# Patient Record
Sex: Female | Born: 1985 | Race: Asian | Hispanic: No | Marital: Married | Smoking: Never smoker
Health system: Southern US, Community
[De-identification: ages and names within clinical notes are randomized; demographics above are authoritative.]

---

## 2013-08-28 ENCOUNTER — Ambulatory Visit (INDEPENDENT_AMBULATORY_CARE_PROVIDER_SITE_OTHER): Payer: 59 | Admitting: Physician Assistant

## 2013-08-28 VITALS — BP 110/64 | HR 89 | Temp 98.1°F | Resp 18 | Ht 64.0 in | Wt 160.0 lb

## 2013-08-28 DIAGNOSIS — J069 Acute upper respiratory infection, unspecified: Secondary | ICD-10-CM

## 2013-08-28 MED ORDER — IPRATROPIUM BROMIDE 0.03 % NA SOLN: 2.0000 | Freq: Two times a day (BID) | NASAL | Status: DC

## 2013-08-28 MED ORDER — BENZONATATE 100 MG PO CAPS: 100.0000 mg | ORAL_CAPSULE | Freq: Three times a day (TID) | ORAL | Status: DC | PRN

## 2013-08-28 MED ORDER — GUAIFENESIN ER 1200 MG PO TB12: 1.0000 | ORAL_TABLET | Freq: Two times a day (BID) | ORAL | Status: DC | PRN

## 2013-08-28 NOTE — Patient Instructions (Signed)
Get plenty of rest and drink at least 64 ounces of water daily. Delegate as much as you can to others, to allow you to rest.   Continue to use ibuprofen (up to 800 mg 3 times daily with food) and/or acetaminophen as needed for throat pain.

## 2013-08-28 NOTE — Progress Notes (Signed)
Subjective:    Patient ID: Jasmine Russell, female    DOB: 08-26-1986, 27 y.o.   MRN: [id number]  HPI This 27 y.o. female presents for evaluation of upper respiratory illness. Began 4 days ago.  High fever (subjective) and chills, nasal congestion and runny nose, sore throat, cough and ear pain. Took ibuprofen, with some benefit.  Robitussin without benefit. Throat is less uncomfortable now, but congestion persists and cough is worse, especially at night. Her children have recently had the same symptoms, but theirs have resolved completely. Some nausea, but no vomiting or diarrhea.   History reviewed. No pertinent past medical history.  History reviewed. No pertinent past surgical history.  Prior to Admission medications   Medication Sig Start Date End Date Taking? Authorizing Provider  ibuprofen (ADVIL,MOTRIN) 200 MG tablet Take 200 mg by mouth every 6 (six) hours as needed for pain.   Yes Historical Provider, MD    No Known Allergies  History   Social History  . Marital Status: Married    Spouse Name: Deane Melick    Number of Children: 2  . Years of Education: college   Occupational History  . Homemaker    Social History Main Topics  . Smoking status: Never Smoker   . Smokeless tobacco: Never Used  . Alcohol Use: No  . Drug Use: No  . Sexual Activity: Not on file   Other Topics Concern  . Not on file   Social History Narrative   Originally from [place], came to the [place] in 2007.  Lives with her husband and their 2 children.  Her parents live in [place].    History reviewed. No pertinent family history.    Review of Systems As above.    Objective:   Physical Exam  Vitals reviewed. Constitutional: She is oriented to person, place, and time. Vital signs are normal. She appears well-developed and well-nourished. No distress.  HENT:  Head: Normocephalic and atraumatic.  Right Ear: Hearing, tympanic membrane, external ear and ear canal normal.  Left Ear: Hearing,  tympanic membrane, external ear and ear canal normal.  Nose: Mucosal edema and rhinorrhea present.  No foreign bodies. Right sinus exhibits no maxillary sinus tenderness and no frontal sinus tenderness. Left sinus exhibits no maxillary sinus tenderness and no frontal sinus tenderness.  Mouth/Throat: Uvula is midline, oropharynx is clear and moist and mucous membranes are normal. No edematous. No oropharyngeal exudate.  Allergic crease noted.  Eyes: Conjunctivae and EOM are normal. Pupils are equal, round, and reactive to light. Right eye exhibits no discharge. Left eye exhibits no discharge. No scleral icterus.  Neck: Trachea normal, normal range of motion and full passive range of motion without pain. Neck supple. No mass and no thyromegaly present.  Cardiovascular: Normal rate, regular rhythm and normal heart sounds.   Pulmonary/Chest: Effort normal and breath sounds normal.  Lymphadenopathy:       Head (right side): No submandibular, no tonsillar, no preauricular, no posterior auricular and no occipital adenopathy present.       Head (left side): No submandibular, no tonsillar, no preauricular and no occipital adenopathy present.    She has no cervical adenopathy.       Right: No supraclavicular adenopathy present.       Left: No supraclavicular adenopathy present.  Neurological: She is alert and oriented to person, place, and time. She has normal strength. No cranial nerve deficit or sensory deficit.  Skin: Skin is warm, dry and intact. No rash noted.  Psychiatric: She  has a normal mood and affect. Her speech is normal and behavior is normal.          Assessment & Plan:

## 2014-02-13 ENCOUNTER — Ambulatory Visit (INDEPENDENT_AMBULATORY_CARE_PROVIDER_SITE_OTHER): Payer: 59 | Admitting: Emergency Medicine

## 2014-02-13 VITALS — BP 130/78 | HR 75 | Temp 97.7°F | Resp 16 | Ht 64.5 in | Wt 164.4 lb

## 2014-02-13 DIAGNOSIS — R002 Palpitations: Secondary | ICD-10-CM

## 2014-02-13 DIAGNOSIS — R42 Dizziness and giddiness: Secondary | ICD-10-CM

## 2014-02-13 DIAGNOSIS — N3000 Acute cystitis without hematuria: Secondary | ICD-10-CM

## 2014-02-13 LAB — POCT URINALYSIS DIPSTICK
Bilirubin, UA: NEGATIVE
Blood, UA: NEGATIVE
GLUCOSE UA: NEGATIVE
KETONES UA: NEGATIVE
Nitrite, UA: NEGATIVE
Protein, UA: NEGATIVE
Urobilinogen, UA: 0.2
pH, UA: 6.5

## 2014-02-13 LAB — POCT CBC
GRANULOCYTE PERCENT: 57.4 % (ref 37–80)
HCT, POC: 38.5 % (ref 37.7–47.9)
Hemoglobin: 12.2 g/dL (ref 12.2–16.2)
Lymph, poc: 3.1 (ref 0.6–3.4)
MCH, POC: 26.3 pg — AB (ref 27–31.2)
MCHC: 31.7 g/dL — AB (ref 31.8–35.4)
MCV: 83.1 fL (ref 80–97)
MID (cbc): 0.6 (ref 0–0.9)
MPV: 9.9 fL (ref 0–99.8)
POC Granulocyte: 5 (ref 2–6.9)
POC LYMPH PERCENT: 35.7 %L (ref 10–50)
POC MID %: 6.9 %M (ref 0–12)
Platelet Count, POC: 278 10*3/uL (ref 142–424)
RBC: 4.63 M/uL (ref 4.04–5.48)
RDW, POC: 13.5 %
WBC: 8.7 10*3/uL (ref 4.6–10.2)

## 2014-02-13 LAB — POCT UA - MICROSCOPIC ONLY
BACTERIA, U MICROSCOPIC: NEGATIVE
Casts, Ur, LPF, POC: NEGATIVE
Crystals, Ur, HPF, POC: NEGATIVE
MUCUS UA: NEGATIVE
Yeast, UA: NEGATIVE

## 2014-02-13 LAB — POCT URINE PREGNANCY: Preg Test, Ur: NEGATIVE

## 2014-02-13 MED ORDER — CIPROFLOXACIN HCL 500 MG PO TABS: 500.0000 mg | ORAL_TABLET | Freq: Two times a day (BID) | ORAL | Status: DC

## 2014-02-13 NOTE — Patient Instructions (Signed)
Palpitations   A palpitation is the feeling that your heartbeat is irregular or is faster than normal. It may feel like your heart is fluttering or skipping a beat. Palpitations are usually not a serious problem. However, in some cases, you may need further medical evaluation.  CAUSES   Palpitations can be caused by:   Smoking.   Caffeine or other stimulants, such as diet pills or energy drinks.   Alcohol.   Stress and anxiety.   Strenuous physical activity.   Fatigue.   Certain medicines.   Heart disease, especially if you have a history of arrhythmias. This includes atrial fibrillation, atrial flutter, or supraventricular tachycardia.   An improperly working pacemaker or defibrillator.  DIAGNOSIS   To find the cause of your palpitations, your caregiver will take your history and perform a physical exam. Tests may also be done, including:   Electrocardiography (ECG). This test records the heart's electrical activity.   Cardiac monitoring. This allows your caregiver to monitor your heart rate and rhythm in real time.   Holter monitor. This is a portable device that records your heartbeat and can help diagnose heart arrhythmias. It allows your caregiver to track your heart activity for several days, if needed.   Stress tests by exercise or by giving medicine that makes the heart beat faster.  TREATMENT   Treatment of palpitations depends on the cause of your symptoms and can vary greatly. Most cases of palpitations do not require any treatment other than time, relaxation, and monitoring your symptoms. Other causes, such as atrial fibrillation, atrial flutter, or supraventricular tachycardia, usually require further treatment.  HOME CARE INSTRUCTIONS    Avoid:   Caffeinated coffee, tea, soft drinks, diet pills, and energy drinks.   Chocolate.   Alcohol.   Stop smoking if you smoke.   Reduce your stress and anxiety. Things that can help you relax include:   A method that measures bodily functions so  you can learn to control them (biofeedback).   Yoga.   Meditation.   Physical activity such as swimming, jogging, or walking.   Get plenty of rest and sleep.  SEEK MEDICAL CARE IF:    You continue to have a fast or irregular heartbeat beyond 24 hours.   Your palpitations occur more often.  SEEK IMMEDIATE MEDICAL CARE IF:   You develop chest pain or shortness of breath.   You have a severe headache.   You feel dizzy, or you faint.  MAKE SURE YOU:   Understand these instructions.   Will watch your condition.   Will get help right away if you are not doing well or get worse.  Document Released: 11/16/2000 Document Revised: 03/16/2013 Document Reviewed: 01/18/2012  Exit[employer] Patient Information 2014 [employer].

## 2014-02-13 NOTE — Progress Notes (Signed)
[facility] [address], [place] [id number] [phone]  Date:  02/13/2014   Name:  Jasmine Russell   DOB:  11/21/1986   MRN:  [id number]  PCP:  No primary provider on file.    Chief Complaint: Menorrhagia and some congestion in throat   History of Present Illness:  Jasmine Russell is a 28 y.o. very pleasant female patient who presents with the following:  Says she missed her period in February and experienced unusually heavy bleeding for the menses in March changing pads every 30 - 60 minutes.  No cramps or tissue.  Sexually active and using condoms.  G2P2. No dyspareunia.  Says she has been dizzy and experiencing a rapid heart rate at times.   Followed by fatigue.  No focal neuro or visual symptoms.  Says she feels palpitations since her last period.  Is concerned she may be anemic.  No improvement with over the counter medications or other home remedies. Denies other complaint or health concern today. .    There are no active problems to display for this patient.   History reviewed. No pertinent past medical history.  History reviewed. No pertinent past surgical history.  History  Substance Use Topics  . Smoking status: Never Smoker   . Smokeless tobacco: Never Used  . Alcohol Use: No    History reviewed. No pertinent family history.  No Known Allergies  Medication list has been reviewed and updated.  Current Outpatient Prescriptions on File Prior to Visit  Medication Sig Dispense Refill  . ibuprofen (ADVIL,MOTRIN) 200 MG tablet Take 200 mg by mouth every 6 (six) hours as needed for pain.       No current facility-administered medications on file prior to visit.    Review of Systems:  As per HPI, otherwise negative.    Physical Examination: Filed Vitals:   02/13/14 1442  BP: 130/78  Pulse: 75  Temp: 97.7 F (36.5 C)  Resp: 16   Filed Vitals:   02/13/14 1442  Height: 5' 4.5" (1.638 m)  Weight: 164 lb 6.4 oz (74.571 kg)   Body  mass index is 27.79 kg/(m^2). Ideal Body Weight: Weight in (lb) to have BMI = 25: 147.6  GEN: WDWN, NAD, Non-toxic, A & O x 3 HEENT: Atraumatic, Normocephalic. Neck supple. No masses, No LAD. Ears and Nose: No external deformity. CV: RRR, No M/G/R. No JVD. No thrill. No extra heart sounds. PULM: CTA B, no wheezes, crackles, rhonchi. No retractions. No resp. distress. No accessory muscle use. ABD: S, NT, ND, +BS. No rebound. No HSM. EXTR: No c/c/e NEURO Normal gait.  PSYCH: Normally interactive. Conversant. Not depressed or anxious appearing.  Calm demeanor.    Assessment and Plan: Cystitis Dizziness Palpitations Normal EKG.  Labs pending   Signed,  Phillips OdorJeffery Zuleyka Kloc, MD   Results for orders placed in visit on 02/13/14  POCT CBC      Result Value Ref Range   WBC 8.7  4.6 - 10.2 K/uL   Lymph, poc 3.1  0.6 - 3.4   POC LYMPH PERCENT 35.7  10 - 50 %L   MID (cbc) 0.6  0 - 0.9   POC MID % 6.9  0 - 12 %M   POC Granulocyte 5.0  2 - 6.9   Granulocyte percent 57.4  37 - 80 %G   RBC 4.63  4.04 - 5.48 M/uL   Hemoglobin 12.2  12.2 - 16.2 g/dL   HCT, POC 64.438.5  03.437.7 -  47.9 %   MCV 83.1  80 - 97 fL   MCH, POC 26.3 (*) 27 - 31.2 pg   MCHC 31.7 (*) 31.8 - 35.4 g/dL   RDW, POC 16.1     Platelet Count, POC 278  142 - 424 K/uL   MPV 9.9  0 - 99.8 fL  POCT URINALYSIS DIPSTICK      Result Value Ref Range   Color, UA yellow     Clarity, UA clear     Glucose, UA neg     Bilirubin, UA neg     Ketones, UA neg     Spec Grav, UA <=1.005     Blood, UA neg     pH, UA 6.5     Protein, UA neg     Urobilinogen, UA 0.2     Nitrite, UA neg     Leukocytes, UA moderate (2+)    POCT UA - MICROSCOPIC ONLY      Result Value Ref Range   WBC, Ur, HPF, POC 1-4     RBC, urine, microscopic 0-1     Bacteria, U Microscopic neg     Mucus, UA neg     Epithelial cells, urine per micros 0-2     Crystals, Ur, HPF, POC neg     Casts, Ur, LPF, POC neg     Yeast, UA neg    POCT URINE PREGNANCY      Result  Value Ref Range   Preg Test, Ur Negative

## 2014-02-14 LAB — COMPREHENSIVE METABOLIC PANEL
ALBUMIN: 4.4 g/dL (ref 3.5–5.2)
ALT: 11 U/L (ref 0–35)
AST: 11 U/L (ref 0–37)
Alkaline Phosphatase: 92 U/L (ref 39–117)
BUN: 11 mg/dL (ref 6–23)
CO2: 26 meq/L (ref 19–32)
Calcium: 9.7 mg/dL (ref 8.4–10.5)
Chloride: 103 mEq/L (ref 96–112)
Creat: 0.74 mg/dL (ref 0.50–1.10)
GLUCOSE: 103 mg/dL — AB (ref 70–99)
POTASSIUM: 4.5 meq/L (ref 3.5–5.3)
SODIUM: 139 meq/L (ref 135–145)
Total Bilirubin: 0.3 mg/dL (ref 0.2–1.2)
Total Protein: 7.6 g/dL (ref 6.0–8.3)

## 2014-02-14 LAB — TSH: TSH: 1.384 u[IU]/mL (ref 0.350–4.500)

## 2014-11-06 ENCOUNTER — Ambulatory Visit (INDEPENDENT_AMBULATORY_CARE_PROVIDER_SITE_OTHER): Payer: 59 | Admitting: Physician Assistant

## 2014-11-06 VITALS — BP 110/70 | HR 96 | Temp 98.4°F | Resp 16 | Ht 64.25 in | Wt 159.0 lb

## 2014-11-06 DIAGNOSIS — N393 Stress incontinence (female) (male): Secondary | ICD-10-CM

## 2014-11-06 DIAGNOSIS — R059 Cough, unspecified: Secondary | ICD-10-CM

## 2014-11-06 DIAGNOSIS — R05 Cough: Secondary | ICD-10-CM

## 2014-11-06 DIAGNOSIS — J069 Acute upper respiratory infection, unspecified: Secondary | ICD-10-CM

## 2014-11-06 MED ORDER — HYDROCODONE-HOMATROPINE 5-1.5 MG/5ML PO SYRP: 5.0000 mL | ORAL_SOLUTION | Freq: Three times a day (TID) | ORAL | Status: AC | PRN

## 2014-11-06 MED ORDER — BENZONATATE 100 MG PO CAPS: 100.0000 mg | ORAL_CAPSULE | Freq: Three times a day (TID) | ORAL | Status: AC | PRN

## 2014-11-06 NOTE — Patient Instructions (Signed)
I'm sorry you're feeling so poorly. I don't think you have the flu or strep throat at this time. I think your symptoms are due to a viral upper respiratory infection. For your cough -- please take the tessalon every 8 hours as needed. I would take this twice during the day 8 hours apart for your cough as it won't make you sleepy. Please take the hycodan at night for the cough. This is the one that may make you sleepy so I would just this at night and the tessalon during the day. If you're not feeling better in 2-3 days please return to clinic.

## 2014-11-06 NOTE — Progress Notes (Signed)
The patient was discussed with me and I agree with the diagnosis and treatment plan.  

## 2014-11-06 NOTE — Progress Notes (Signed)
Subjective:    Patient ID: Jasmine Russell, female    DOB: 11/24/1986, 28 y.o.   MRN: [id number]  PCP: No PCP Per Patient  Chief Complaint  Patient presents with  . Fever    x 5 days  . Cough    x 3 days  . Sore Throat    x 3 days   There are no active problems to display for this patient.  Prior to Admission medications   Medication Sig Start Date End Date Taking? Authorizing Provider  ibuprofen (ADVIL,MOTRIN) 200 MG tablet Take 200 mg by mouth every 6 (six) hours as needed for pain.   Yes Historical Provider, MD  benzonatate (TESSALON) 100 MG capsule Take 1-2 capsules (100-200 mg total) by mouth 3 (three) times daily as needed for cough. 11/06/14   Trexton Escamilla, PA  HYDROcodone-homatropine (HYCODAN) 5-1.5 MG/5ML syrup Take 5 mLs by mouth every 8 (eight) hours as needed for cough. 11/06/14   Raelyn Ensignodd Jerrika Ledlow, PA   Medications, allergies, past medical history, surgical history, family history, social history and problem list reviewed and updated.  HPI  89 yof with no sig PMH presents with fever, cough, and sore throat.  Sx started approx 6 days after her daughter was sick. Sx initially were fever at home, then sore throat. The fevers and sore throat have eased off but she has not had a cough for the past few days. Non-prod. Dayquil and ibuprofen not helping much. No ear pain. No abd pain, N/V, diarrhea. No congestion, no rhinorrhea.   At very end of appt pt mentioned she is having small leakage of urine with coughing. Two children were born vaginally.   Review of Systems No CP, SOB.     Objective:   Physical Exam  Constitutional: She is oriented to person, place, and time. She appears well-developed and well-nourished.  Non-toxic appearance. She does not have a sickly appearance. She does not appear ill. No distress.  BP 110/70 mmHg  Pulse 96  Temp(Src) 98.4 F (36.9 C) (Oral)  Resp 16  Ht 5' 4.25" (1.632 m)  Wt 159 lb (72.122 kg)  BMI 27.08 kg/m2  SpO2 96%  LMP 10/31/2014   HENT:  Right Ear: Tympanic membrane normal.  Left Ear: Tympanic membrane normal.  Nose: Nose normal. No mucosal edema or rhinorrhea. Right sinus exhibits no maxillary sinus tenderness and no frontal sinus tenderness. Left sinus exhibits no maxillary sinus tenderness and no frontal sinus tenderness.  Mouth/Throat: Uvula is midline and oropharynx is clear and moist. No oropharyngeal exudate, posterior oropharyngeal edema or posterior oropharyngeal erythema.  Cardiovascular: Normal rate, regular rhythm and normal heart sounds.   Pulmonary/Chest: Effort normal and breath sounds normal. No respiratory distress. She has no decreased breath sounds. She has no wheezes. She has no rhonchi. She has no rales.  Lymphadenopathy:       Head (right side): No submental, no submandibular and no tonsillar adenopathy present.       Head (left side): No submental, no submandibular and no tonsillar adenopathy present.    She has no cervical adenopathy.  Neurological: She is alert and oriented to person, place, and time.  Psychiatric: She has a normal mood and affect. Her speech is normal.      Assessment & Plan:   89 yof with no sig PMH presents with fever, cough, and sore throat.  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 Asriel Westrup, PA-C Physician Assistant-Certified [facility]  11/06/2014 9:55 AM

## 2014-11-08 ENCOUNTER — Telehealth: Payer: Self-pay

## 2014-11-08 NOTE — Telephone Encounter (Signed)
Patient is wanting to know the status of her earlier message regarding her cough not getting any better.   Best#: [phone]

## 2014-11-08 NOTE — Telephone Encounter (Signed)
Pt states that she was in on Saturday for a cough, she states that the cough is not any better and she would to know if she could get a different medication called into the pharmacy. Bets# [phone]  Pharmacy: [facility] [address]

## 2014-11-08 NOTE — Telephone Encounter (Signed)
Does she have any new symptoms? Any fever, chills? Any post-nasal drainage? She was given both LawyerTessalon Perles (for daytime) and Hycodan (for night time). Is NEITHER of them helping at all?  Often, the cough lingers after an illness like she's had.  It can take several weeks to resolve. However, if she has other symptoms (see above; facial pressure/pain, congestion, etc), she may need an antibiotic. Usually we need to re-evaluate for that, so that the appropriate medication can be selected for her illness.

## 2014-11-08 NOTE — Telephone Encounter (Signed)
Called and spoke to pt. Her cough is about the same; it's not worse, but it's not better. I read in Jasmine Russell's note for the pt to RTC in 2-3 days if not feeling better. I advised pt that [name] will be in the office tomorrow and she could see her again tomorrow. Pt would rather not RTC and just have different medication prescribed.

## 2014-11-09 NOTE — Telephone Encounter (Signed)
Tried calling pt. Left VM to call back and discuss whether she has any new symptoms or not.

## 2014-11-10 NOTE — Telephone Encounter (Signed)
Pt is feeling better. She went to her PCP and received Hycodan from him.

## 2020-05-31 ENCOUNTER — Other Ambulatory Visit: Payer: Self-pay

## 2020-05-31 ENCOUNTER — Emergency Department (HOSPITAL_BASED_OUTPATIENT_CLINIC_OR_DEPARTMENT_OTHER)
Admission: EM | Admit: 2020-05-31 | Discharge: 2020-06-01 | Disposition: A | Discharge status: Home / Self Care | Payer: 59 | Attending: Emergency Medicine | Admitting: Emergency Medicine

## 2020-05-31 ENCOUNTER — Encounter (HOSPITAL_BASED_OUTPATIENT_CLINIC_OR_DEPARTMENT_OTHER): Payer: Self-pay | Admitting: Emergency Medicine

## 2020-05-31 DIAGNOSIS — Z20822 Contact with and (suspected) exposure to covid-19: Secondary | ICD-10-CM | POA: Diagnosis not present

## 2020-05-31 DIAGNOSIS — R059 Cough, unspecified: Secondary | ICD-10-CM

## 2020-05-31 DIAGNOSIS — R05 Cough: Secondary | ICD-10-CM | POA: Insufficient documentation

## 2020-05-31 NOTE — ED Triage Notes (Signed)
Pt states she went to get a facial yesterday and since has had an annoying cough

## 2020-06-01 ENCOUNTER — Emergency Department (HOSPITAL_BASED_OUTPATIENT_CLINIC_OR_DEPARTMENT_OTHER): Payer: 59

## 2020-06-01 ENCOUNTER — Encounter (HOSPITAL_BASED_OUTPATIENT_CLINIC_OR_DEPARTMENT_OTHER): Payer: Self-pay | Admitting: Emergency Medicine

## 2020-06-01 LAB — SARS CORONAVIRUS 2 (TAT 6-24 HRS): SARS Coronavirus 2: NEGATIVE

## 2020-06-01 MED ORDER — BENZONATATE 100 MG PO CAPS
100.0000 mg | ORAL_CAPSULE | Freq: Three times a day (TID) | ORAL | 0 refills | Status: AC
Start: 2020-06-01 — End: ?

## 2020-06-01 MED ORDER — CETIRIZINE HCL 10 MG PO TABS
10.0000 mg | ORAL_TABLET | Freq: Every day | ORAL | 0 refills | Status: AC
Start: 2020-06-01 — End: ?

## 2020-06-01 MED ORDER — BENZONATATE 100 MG PO CAPS
200.0000 mg | ORAL_CAPSULE | Freq: Once | ORAL | Status: AC
  Administered 2020-06-01: 200 mg via ORAL
  Filled 2020-06-01: qty 2

## 2020-06-01 NOTE — ED Provider Notes (Signed)
[facility] EMERGENCY DEPARTMENT Provider Note   CSN: [id number] Arrival date & time: 05/31/20  2313     History Chief Complaint  Patient presents with   Cough    Jasmine Russell is a 34 y.o. female.  The history is provided by the patient.  Cough Severity:  Moderate Onset quality:  Gradual Duration:  2 days Timing:  Sporadic Progression:  Unchanged Chronicity:  New Context comment:  Was at a wedding in [place], traveled on a plane Relieved by:  Nothing Worsened by:  Nothing Ineffective treatments:  None tried Associated symptoms: no chest pain, no chills, no diaphoresis, no ear fullness, no ear pain, no eye discharge, no fever, no headaches, no myalgias, no rash, no rhinorrhea, no shortness of breath, no sinus congestion, no sore throat, no weight loss and no wheezing   Risk factors: recent travel   Risk factors: no chemical exposure   Patient at a wedding in [place] and developed a cough after coming home.  No f/c/r.  No loss of taste or smell.       History reviewed. No pertinent past medical history.  There are no problems to display for this patient.   History reviewed. No pertinent surgical history.   OB History   No obstetric history on file.     Family History  Problem Relation Age of Onset   Hypertension Mother    Hypertension Father     Social History   Tobacco Use   Smoking status: Never Smoker   Smokeless tobacco: Never Used  Vaping Use   Vaping Use: Never used  Substance Use Topics   Alcohol use: No   Drug use: No    Home Medications Prior to Admission medications   Medication Sig Start Date End Date Taking? Authorizing Provider  benzonatate (TESSALON) 100 MG capsule Take 1-2 capsules (100-200 mg total) by mouth 3 (three) times daily as needed for cough. 11/06/14   McVeigh, Todd, PA  HYDROcodone-homatropine (HYCODAN) 5-1.5 MG/5ML syrup Take 5 mLs by mouth every 8 (eight) hours as needed for cough. 11/06/14   McVeigh,  Tawanna Cooler, PA  ibuprofen (ADVIL,MOTRIN) 200 MG tablet Take 200 mg by mouth every 6 (six) hours as needed for pain.    [provider]    Allergies    Penicillins  Review of Systems   Review of Systems  Constitutional: Negative for chills, diaphoresis, fever and weight loss.  HENT: Negative for ear pain, rhinorrhea and sore throat.   Eyes: Negative for discharge.  Respiratory: Positive for cough. Negative for shortness of breath and wheezing.   Cardiovascular: Negative for chest pain.  Gastrointestinal: Negative for abdominal pain.  Musculoskeletal: Negative for myalgias.  Skin: Negative for rash.  Neurological: Negative for headaches.  Psychiatric/Behavioral: Negative for agitation.  All other systems reviewed and are negative.   Physical Exam Updated Vital Signs BP 108/86 (BP Location: Left Arm)    Pulse 80    Temp 98.3 F (36.8 C) (Oral)    Resp 18    Ht 5\' 4"  (1.626 m)    Wt 68 kg    LMP 05/28/2020 (Exact Date)    SpO2 100%    BMI 25.75 kg/m   Physical Exam Vitals and nursing note reviewed.  Constitutional:      General: She is not in acute distress.    Appearance: Normal appearance.  HENT:     Head: Normocephalic and atraumatic.     Nose: Nose normal.     Mouth/Throat:  Mouth: Mucous membranes are moist.     Pharynx: Oropharynx is clear. No oropharyngeal exudate or posterior oropharyngeal erythema.  Eyes:     Conjunctiva/sclera: Conjunctivae normal.     Pupils: Pupils are equal, round, and reactive to light.  Cardiovascular:     Rate and Rhythm: Normal rate and regular rhythm.     Pulses: Normal pulses.     Heart sounds: Normal heart sounds.  Pulmonary:     Effort: Pulmonary effort is normal.     Breath sounds: Normal breath sounds. No stridor.  Abdominal:     General: Abdomen is flat. Bowel sounds are normal.     Tenderness: There is no abdominal tenderness. There is no guarding.  Musculoskeletal:        General: Normal range of motion.     Cervical  back: Normal range of motion and neck supple.  Skin:    General: Skin is warm and dry.     Capillary Refill: Capillary refill takes less than 2 seconds.  Neurological:     General: No focal deficit present.     Mental Status: She is alert and oriented to person, place, and time.     Deep Tendon Reflexes: Reflexes normal.  Psychiatric:        Mood and Affect: Mood normal.        Behavior: Behavior normal.     ED Results / Procedures / Treatments   Labs (all labs ordered are listed, but only abnormal results are displayed) Labs Reviewed  SARS CORONAVIRUS 2 (TAT 6-24 HRS)    EKG None  Radiology DG Chest Portable 1 View  Result Date: 06/01/2020 CLINICAL DATA:  Cough EXAM: PORTABLE CHEST 1 VIEW COMPARISON:  None. FINDINGS: The heart size and mediastinal contours are within normal limits. Both lungs are clear. The visualized skeletal structures are unremarkable. IMPRESSION: No active disease. Electronically Signed   By: Sharlet Salina M.D.   On: 06/01/2020 00:13    Procedures Procedures (including critical care time)  Medications Ordered in ED Medications  benzonatate (TESSALON) capsule 200 mg (200 mg Oral Given 06/01/20 0048)    ED Course  I have reviewed the triage vital signs and the nursing notes.  Pertinent labs & imaging results that were available during my care of the patient were reviewed by me and considered in my medical decision making (see chart for details).    Lungs are clear.  Airway is widely patent.  COVID swab sent as this can cause a dry cough like this.  No f/c/r.  No loss of taste of smell.  COVID swab will be in my chart.   Allergies can also cause this.  I have written for tessalon and zyrtec.  The patient has not tried anything for her symptoms and this is a reasonable plan for the cough.    Jasmine Russell was evaluated in Emergency Department on 06/01/2020 for the symptoms described in the history of present illness. She was evaluated in the context  of the global COVID-19 pandemic, which necessitated consideration that the patient might be at risk for infection with the SARS-CoV-2 virus that causes COVID-19. Institutional protocols and algorithms that pertain to the evaluation of patients at risk for COVID-19 are in a state of rapid change based on information released by regulatory bodies including the CDC and federal and state organizations. These policies and algorithms were followed during the patient's care in the ED.  Final Clinical Impression(s) / ED Diagnoses Return for intractable cough, coughing up  blood,fevers >100.4 unrelieved by medication, shortness of breath, intractable vomiting, chest pain, shortness of breath, weakness,numbness, changes in speech, facial asymmetry,abdominal pain, passing out,Inability to tolerate liquids or food, cough, altered mental status or any concerns. No signs of systemic illness or infection. The patient is nontoxic-appearing on exam and vital signs are within normal limits.   I have reviewed the triage vital signs and the nursing notes. Pertinent labs &imaging results that were available during my care of the patient were reviewed by me and considered in my medical decision making (see chart for details).After history, exam, and medical workup I feel the patient has beenappropriately medically screened and is safe for discharge home. Pertinent diagnoses were discussed with the patient. Patient was given return precautions.      Genesys Coggeshall, MD 06/01/20 [phone]

## 2021-10-24 IMAGING — DX DG CHEST 1V PORT
1 series · 1 of 1 positions shown · non-contrast
Comparison: None.

CLINICAL DATA: Cough

EXAM:
PORTABLE CHEST 1 VIEW

[chest ap]
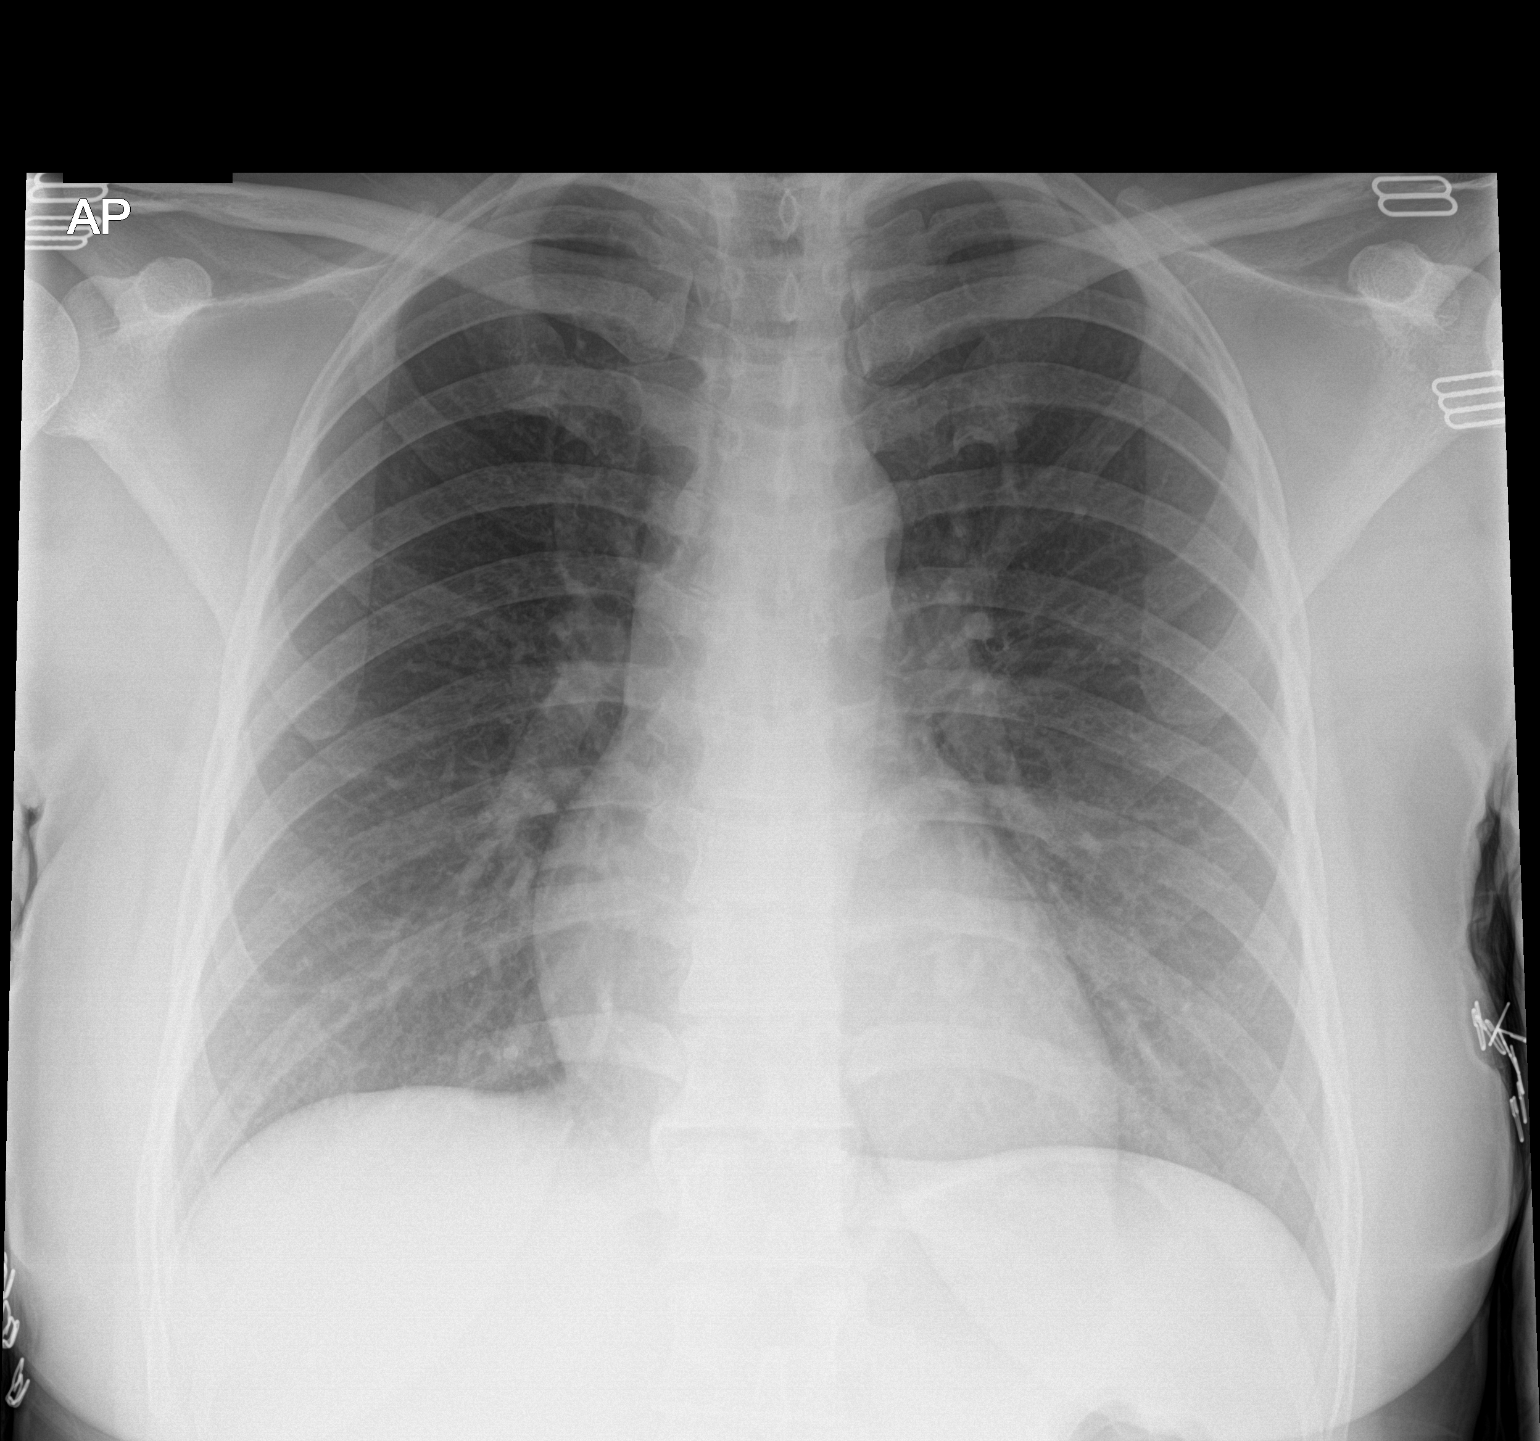

[1 of 1 positions shown; findings below may reference images not displayed]

FINDINGS: The heart size and mediastinal contours are within normal limits.
Both lungs are clear. The visualized skeletal structures are
unremarkable.
IMPRESSION: No active disease.
